# Patient Record
Sex: Male | Born: 1955 | Race: Black or African American | Hispanic: No | Marital: Single | Smoking: Never smoker
Health system: Southern US, Community
[De-identification: ages and names within clinical notes are randomized; demographics above are authoritative.]

## PROBLEM LIST (undated history)

## (undated) DIAGNOSIS — I10 Essential (primary) hypertension: Secondary | ICD-10-CM

## (undated) HISTORY — PX: KNEE SURGERY: SHX244

## (undated) HISTORY — PX: BACK SURGERY: SHX140

---

## 2017-10-02 ENCOUNTER — Emergency Department (HOSPITAL_BASED_OUTPATIENT_CLINIC_OR_DEPARTMENT_OTHER)
Admission: EM | Admit: 2017-10-02 | Discharge: 2017-10-02 | Disposition: A | Discharge status: Home / Self Care | Payer: 59 | Attending: Emergency Medicine | Admitting: Emergency Medicine

## 2017-10-02 ENCOUNTER — Other Ambulatory Visit: Payer: Self-pay

## 2017-10-02 ENCOUNTER — Encounter (HOSPITAL_BASED_OUTPATIENT_CLINIC_OR_DEPARTMENT_OTHER): Payer: Self-pay | Admitting: *Deleted

## 2017-10-02 DIAGNOSIS — J069 Acute upper respiratory infection, unspecified: Secondary | ICD-10-CM | POA: Insufficient documentation

## 2017-10-02 DIAGNOSIS — I10 Essential (primary) hypertension: Secondary | ICD-10-CM | POA: Insufficient documentation

## 2017-10-02 DIAGNOSIS — Z79899 Other long term (current) drug therapy: Secondary | ICD-10-CM | POA: Insufficient documentation

## 2017-10-02 DIAGNOSIS — R05 Cough: Secondary | ICD-10-CM | POA: Diagnosis present

## 2017-10-02 DIAGNOSIS — B9789 Other viral agents as the cause of diseases classified elsewhere: Secondary | ICD-10-CM

## 2017-10-02 HISTORY — DX: Essential (primary) hypertension: I10

## 2017-10-02 MED ORDER — BENZONATATE 100 MG PO CAPS: 100.0000 mg | ORAL_CAPSULE | Freq: Three times a day (TID) | ORAL | 0 refills | Status: AC

## 2017-10-02 MED ORDER — GUAIFENESIN 100 MG/5ML PO LIQD: 100.0000 mg | ORAL | 0 refills | Status: AC | PRN

## 2017-10-02 NOTE — ED Provider Notes (Signed)
[facility] EMERGENCY DEPARTMENT Provider Note   CSN: [id number] Arrival date & time: 10/02/17  1646     History   Chief Complaint Chief Complaint  Patient presents with  . Nasal Congestion    HPI Chris Chan is a 62 y.o. male.  HPI   62 year old male presenting with cold symptoms.  Patient report for the past week he has had sinus congestion, decreased taste bud, cannot smell anything, having chest congestion, and nonproductive cough.  No report of fever, chills, headache, chest pain, shortness of breath, abdominal pain, vomiting, diarrhea, body aches or rash.  He was initially seen at an outside hospital for his symptoms.  States he was given cough medication but his symptoms does not resolve.  He denies tobacco abuse or alcohol abuse.  Past Medical History:  Diagnosis Date  . Hypertension     There are no active problems to display for this patient.   Past Surgical History:  Procedure Laterality Date  . BACK SURGERY    . KNEE SURGERY         Home Medications    Prior to Admission medications   Medication Sig Start Date End Date Taking? Authorizing Provider  AMLODIPINE BESYLATE PO Take by mouth.   Yes [provider]  ATORVASTATIN CALCIUM PO Take by mouth.   Yes [provider]  LOSARTAN POTASSIUM PO Take by mouth.   Yes [provider]    Family History No family history on file.  Social History Social History   Tobacco Use  . Smoking status: Never Smoker  . Smokeless tobacco: Never Used  Substance Use Topics  . Alcohol use: Yes    Comment: weekly  . Drug use: No     Allergies   Prednisone   Review of Systems Review of Systems  All other systems reviewed and are negative.    Physical Exam Updated Vital Signs BP (!) 122/91 (BP Location: Right Arm)   Pulse 80   Temp 98.7 F (37.1 C) (Oral)   Resp 16   Ht 6\' 2"  (1.88 m)   Wt 90.7 kg (200 lb)   SpO2 98%   BMI 25.68 kg/m   Physical Exam    Constitutional: He is oriented to person, place, and time. He appears well-developed and well-nourished. No distress.  HENT:  Head: Atraumatic.  Right Ear: External ear normal.  Left Ear: External ear normal.  Nose: Nose normal.  Mouth/Throat: Oropharynx is clear and moist.  Eyes: Conjunctivae are normal.  Neck: Neck supple.  Cardiovascular: Normal rate and regular rhythm.  Pulmonary/Chest: Effort normal and breath sounds normal. No respiratory distress. He has no wheezes. He has no rales.  Abdominal: Soft. He exhibits no distension. There is no tenderness.  Lymphadenopathy:    He has no cervical adenopathy.  Neurological: He is alert and oriented to person, place, and time.  Skin: No rash noted.  Psychiatric: He has a normal mood and affect.  Nursing note and vitals reviewed.    ED Treatments / Results  Labs (all labs ordered are listed, but only abnormal results are displayed) Labs Reviewed - No data to display  EKG  EKG Interpretation None       Radiology No results found.  Procedures Procedures (including critical care time)  Medications Ordered in ED Medications - No data to display   Initial Impression / Assessment and Plan / ED Course  I have reviewed the triage vital signs and the nursing notes.  Pertinent labs &  imaging results that were available during my care of the patient were reviewed by me and considered in my medical decision making (see chart for details).     BP (!) 122/91 (BP Location: Right Arm)   Pulse 80   Temp 98.7 F (37.1 C) (Oral)   Resp 16   Ht 6\' 2"  (1.88 m)   Wt 90.7 kg (200 lb)   SpO2 98%   BMI 25.68 kg/m    Final Clinical Impressions(s) / ED Diagnoses   Final diagnoses:  Viral URI with cough    ED Discharge Orders        Ordered    guaiFENesin (ROBITUSSIN) 100 MG/5ML liquid  Every 4 hours PRN     10/02/17 1903    benzonatate (TESSALON) 100 MG capsule  Every 8 hours     10/02/17 1903     Pt symptoms  consistent with URI.  Pt will be discharged with symptomatic treatment.  Discussed return precautions.  Pt is hemodynamically stable & in NAD prior to discharge.    Fayrene Helperran, Dilana Mcphie, PA-C 10/02/17 1905    Rolan BuccoBelfi, Melanie, MD 10/02/17 [phone]

## 2017-10-02 NOTE — ED Notes (Signed)
Pt discharged to home with family. NAD.  

## 2017-10-02 NOTE — ED Triage Notes (Signed)
Cough, congestion, "can't smell" x 1 week

## 2019-03-25 ENCOUNTER — Emergency Department (HOSPITAL_BASED_OUTPATIENT_CLINIC_OR_DEPARTMENT_OTHER): Payer: 59

## 2019-03-25 ENCOUNTER — Encounter (HOSPITAL_BASED_OUTPATIENT_CLINIC_OR_DEPARTMENT_OTHER): Payer: Self-pay | Admitting: *Deleted

## 2019-03-25 ENCOUNTER — Other Ambulatory Visit: Payer: Self-pay

## 2019-03-25 ENCOUNTER — Emergency Department (HOSPITAL_BASED_OUTPATIENT_CLINIC_OR_DEPARTMENT_OTHER)
Admission: EM | Admit: 2019-03-25 | Discharge: 2019-03-25 | Disposition: A | Discharge status: Home / Self Care | Payer: 59 | Attending: Emergency Medicine | Admitting: Emergency Medicine

## 2019-03-25 DIAGNOSIS — M47812 Spondylosis without myelopathy or radiculopathy, cervical region: Secondary | ICD-10-CM | POA: Diagnosis not present

## 2019-03-25 DIAGNOSIS — Z79899 Other long term (current) drug therapy: Secondary | ICD-10-CM | POA: Diagnosis not present

## 2019-03-25 DIAGNOSIS — I10 Essential (primary) hypertension: Secondary | ICD-10-CM | POA: Insufficient documentation

## 2019-03-25 DIAGNOSIS — M542 Cervicalgia: Secondary | ICD-10-CM | POA: Diagnosis present

## 2019-03-25 LAB — COMPREHENSIVE METABOLIC PANEL
ALT: 22 U/L (ref 0–44)
AST: 21 U/L (ref 15–41)
Albumin: 4.4 g/dL (ref 3.5–5.0)
Alkaline Phosphatase: 72 U/L (ref 38–126)
Anion gap: 13 (ref 5–15)
BUN: 14 mg/dL (ref 8–23)
CO2: 23 mmol/L (ref 22–32)
Calcium: 9.9 mg/dL (ref 8.9–10.3)
Chloride: 102 mmol/L (ref 98–111)
Creatinine, Ser: 1.01 mg/dL (ref 0.61–1.24)
GFR calc Af Amer: >60 mL/min (ref >60)
GFR calc non Af Amer: >60 mL/min (ref >60)
Glucose, Bld: 96 mg/dL (ref 70–99)
Potassium: 3.4 mmol/L — ABNORMAL LOW (ref 3.5–5.1)
Sodium: 138 mmol/L (ref 135–145)
Total Bilirubin: 0.7 mg/dL (ref 0.3–1.2)
Total Protein: 7.2 g/dL (ref 6.5–8.1)

## 2019-03-25 LAB — CBC WITH DIFFERENTIAL/PLATELET
Abs Immature Granulocytes: 0.02 10*3/uL (ref 0.00–0.07)
Basophils Absolute: 0 10*3/uL (ref 0.0–0.1)
Basophils Relative: 0 %
Eosinophils Absolute: 0.4 10*3/uL (ref 0.0–0.5)
Eosinophils Relative: 5 %
HCT: 34.1 % — ABNORMAL LOW (ref 39.0–52.0)
Hemoglobin: 12.1 g/dL — ABNORMAL LOW (ref 13.0–17.0)
Immature Granulocytes: 0 %
Lymphocytes Relative: 25 %
Lymphs Abs: 2 10*3/uL (ref 0.7–4.0)
MCH: 27.1 pg (ref 26.0–34.0)
MCHC: 35.5 g/dL (ref 30.0–36.0)
MCV: 76.5 fL — ABNORMAL LOW (ref 80.0–100.0)
Monocytes Absolute: 0.9 10*3/uL (ref 0.1–1.0)
Monocytes Relative: 11 %
Neutro Abs: 4.6 10*3/uL (ref 1.7–7.7)
Neutrophils Relative %: 59 %
Platelets: 307 10*3/uL (ref 150–400)
RBC: 4.46 MIL/uL (ref 4.22–5.81)
RDW: 16.3 % — ABNORMAL HIGH (ref 11.5–15.5)
WBC: 7.9 10*3/uL (ref 4.0–10.5)
nRBC: 0 % (ref 0.0–0.2)

## 2019-03-25 LAB — TROPONIN I (HIGH SENSITIVITY): Troponin I (High Sensitivity): 3 ng/L (ref <18)

## 2019-03-25 MED ORDER — CYCLOBENZAPRINE HCL 5 MG PO TABS: 5.0000 mg | ORAL_TABLET | Freq: Three times a day (TID) | ORAL | 0 refills | Status: AC | PRN

## 2019-03-25 MED ORDER — CYCLOBENZAPRINE HCL 5 MG PO TABS
5.0000 mg | ORAL_TABLET | Freq: Once | ORAL | Status: AC
  Administered 2019-03-25: 5 mg via ORAL
  Filled 2019-03-25: qty 1

## 2019-03-25 MED ORDER — FAMOTIDINE IN NACL 20-0.9 MG/50ML-% IV SOLN: 20.0000 mg | Freq: Once | INTRAVENOUS | Status: DC

## 2019-03-25 NOTE — ED Triage Notes (Addendum)
Pt reports spasms in his left shoulder and neck for week. States he "needs to burp" after eating and is unable to do so now. Reports feeling "gas" in left chest

## 2019-03-25 NOTE — ED Provider Notes (Signed)
[facility] EMERGENCY DEPARTMENT Provider Note   CSN: [id number] Arrival date & time: 03/25/19  2012     History   Chief Complaint Chief Complaint  Patient presents with  . Shoulder Pain    neck pain    HPI Chris Chan is a 63 y.o. male hypertension, cervical radiculopathy here presenting with neck pain as well as burning sensation in the left chest.  Patient has been having left-sided neck pain and spasms for the last week or so.  He states that for the last 3 days, he felt like he was unable to burp and has a burning sensation on the left side of his chest.  Denies any shortness of breath.  He tried some Alka-Seltzer with minimal relief. Denies any abdominal pain or vomiting or fevers.  Patient was seen at [facility] earlier this month and had a CT that showed diffuse arthritis.  He has no known cardiac disease. No recent travel or hx of blood clots.      The history is provided by the patient.    Past Medical History:  Diagnosis Date  . Hypertension     There are no active problems to display for this patient.   Past Surgical History:  Procedure Laterality Date  . BACK SURGERY    . KNEE SURGERY          Home Medications    Prior to Admission medications   Medication Sig Start Date End Date Taking? Authorizing Provider  AMLODIPINE BESYLATE PO Take by mouth.    [provider]  ATORVASTATIN CALCIUM PO Take by mouth.    [provider]  benzonatate (TESSALON) 100 MG capsule Take 1 capsule (100 mg total) by mouth every 8 (eight) hours. 10/02/17   Fayrene Helperran, [place], PA-C  guaiFENesin (ROBITUSSIN) 100 MG/5ML liquid Take 5-10 mLs (100-200 mg total) by mouth every 4 (four) hours as needed for cough or congestion. 10/02/17   Fayrene Helperran, [place], PA-C  LOSARTAN POTASSIUM PO Take by mouth.    [provider]    Family History No family history on file.  Social History Social History   Tobacco Use  . Smoking status: Never Smoker  .  Smokeless tobacco: Never Used  Substance Use Topics  . Alcohol use: Not Currently    Comment: weekly  . Drug use: No     Allergies   Prednisone   Review of Systems Review of Systems  Cardiovascular: Positive for chest pain.  Musculoskeletal: Positive for back pain.  All other systems reviewed and are negative.    Physical Exam Updated Vital Signs BP (!) 123/91 (BP Location: Right Arm)   Pulse 81   Temp 98.3 F (36.8 C) (Oral)   Resp 14   Ht 6' 2.5" (1.892 m)   Wt 86.2 kg   SpO2 97%   BMI 24.07 kg/m   Physical Exam Vitals signs and nursing note reviewed.  Constitutional:      Appearance: Normal appearance.  HENT:     Head: Normocephalic.     Mouth/Throat:     Mouth: Mucous membranes are moist.  Eyes:     Extraocular Movements: Extraocular movements intact.     Pupils: Pupils are equal, round, and reactive to light.  Neck:     Musculoskeletal: Normal range of motion and neck supple.     Comments: Mild reproducible L paracervical tenderness, no obvious midline tenderness, nl ROM  Cardiovascular:     Rate and Rhythm: Normal rate and regular rhythm.  Pulses: Normal pulses.     Heart sounds: Normal heart sounds.  Pulmonary:     Effort: Pulmonary effort is normal.     Breath sounds: Normal breath sounds.     Comments: Mild reproducible L chest wall tenderness.  Abdominal:     General: Abdomen is flat.     Palpations: Abdomen is soft.  Musculoskeletal: Normal range of motion.  Skin:    General: Skin is warm.     Capillary Refill: Capillary refill takes less than 2 seconds.  Neurological:     General: No focal deficit present.     Mental Status: He is alert and oriented to person, place, and time.     Comments: CN 2- 12 intact, nl strength and sensation throughout, nl gait   Psychiatric:        Mood and Affect: Mood normal.      ED Treatments / Results  Labs (all labs ordered are listed, but only abnormal results are displayed) Labs Reviewed  CBC  WITH DIFFERENTIAL/PLATELET - Abnormal; Notable for the following components:      Result Value   Hemoglobin 12.1 (*)    HCT 34.1 (*)    MCV 76.5 (*)    RDW 16.3 (*)    All other components within normal limits  COMPREHENSIVE METABOLIC PANEL  TROPONIN I (HIGH SENSITIVITY)    EKG None  Radiology Dg Chest 2 View  Result Date: 03/25/2019 CLINICAL DATA:  Chest pain EXAM: CHEST - 2 VIEW COMPARISON:  03/06/2019 FINDINGS: The heart size and mediastinal contours are within normal limits. Both lungs are clear. Disc degenerative disease of the thoracic spine. IMPRESSION: No acute abnormality of the lungs. Electronically Signed   By: Eddie Candle M.D.   On: 03/25/2019 20:56   Dg Cervical Spine Complete  Result Date: 03/25/2019 CLINICAL DATA:  Spasms in LEFT shoulder and neck for a week, denies injury EXAM: CERVICAL SPINE - COMPLETE 4+ VIEW COMPARISON:  None FINDINGS: Prevertebral soft tissues normal thickness. Multilevel disc space narrowing and endplate spur formation throughout cervical spine. Encroachment upon cervical neural foramina BILATERALLY at multiple levels by uncovertebral spurs. Scattered facet degenerative changes. Mild anterolisthesis at C2-C3, approximately 3 mm. Minimal anterolisthesis C5-C6. No fracture, additional subluxation, or bone destruction. 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 If patient has radicular symptoms to the upper extremities, consider follow-up MR imaging of the cervical spine for further assessment. Electronically Signed   By: Lavonia Dana M.D.   On: 03/25/2019 20:57    Procedures Procedures (including critical care time)  Medications Ordered in ED Medications  cyclobenzaprine (FLEXERIL) tablet 5 mg (5 mg Oral Given 03/25/19 2109)     Initial Impression / Assessment and Plan / ED Course  I have reviewed the triage vital signs and the nursing notes.  Pertinent labs & imaging results that were  available during my care of the patient were reviewed by me and considered in my medical decision making (see chart for details).       Chris Chan is a 63 y.o. male here with L neck pain, burning sensation in the left chest.  Pain is reproducible and had previous arthritis on x-rays.  Patient has unremarkable neuro exams currently.  I think his chest pain is radiation from the neck pain.  He is low risk for ACS and has low heart score and pain has been present for more than 24 hours so once her troponin is sufficient.  I  doubt PE or dissection .  Will get cxr, cervical xray, labs, trop x 1.   10:04 PM CXR clear. Xrays showed arthritis.  Patient has normal strength and sensation in the left upper extremity.  Troponin negative x1.  I think his pain is likely from arthritis in his neck.  Stable for discharge with Flexeril.  Will refer him to spine doctor for further treatment and evaluation.  Patient states that he cannot take prednisone.   Final Clinical Impressions(s) / ED Diagnoses   Final diagnoses:  None    ED Discharge Orders    None       Charlynne PanderYao, Jakwan Sally Hsienta, MD 03/25/19 2205

## 2019-03-25 NOTE — Discharge Instructions (Addendum)
Continue your meloxicam.   Take flexeril for muscle spasms   You have arthritis in your neck and it may be pinching your nerves. Consider following up with neurosurgeon to discuss treatment options   Return to ER if you have worse neck pain, chest pain, trouble breathing.

## 2019-03-25 NOTE — ED Triage Notes (Signed)
MD at bedside during triage. Given ekg to review

## 2019-04-11 ENCOUNTER — Other Ambulatory Visit: Payer: Self-pay | Admitting: Neurosurgery

## 2019-04-11 DIAGNOSIS — M4802 Spinal stenosis, cervical region: Secondary | ICD-10-CM

## 2019-05-05 ENCOUNTER — Other Ambulatory Visit: Payer: Self-pay

## 2019-05-05 ENCOUNTER — Ambulatory Visit
Admission: RE | Admit: 2019-05-05 | Discharge: 2019-05-05 | Disposition: A | Discharge status: Home / Self Care | Payer: 59 | Source: Ambulatory Visit | Attending: Neurosurgery | Admitting: Neurosurgery

## 2019-05-05 DIAGNOSIS — M4802 Spinal stenosis, cervical region: Secondary | ICD-10-CM

## 2021-02-13 IMAGING — CR CERVICAL SPINE - COMPLETE 4+ VIEW
5 series · 5 of 5 positions shown · non-contrast
Comparison: None

CLINICAL DATA: Spasms in LEFT shoulder and neck for a week, denies
injury

EXAM:
CERVICAL SPINE - COMPLETE 4+ VIEW

[w c-spine lat]
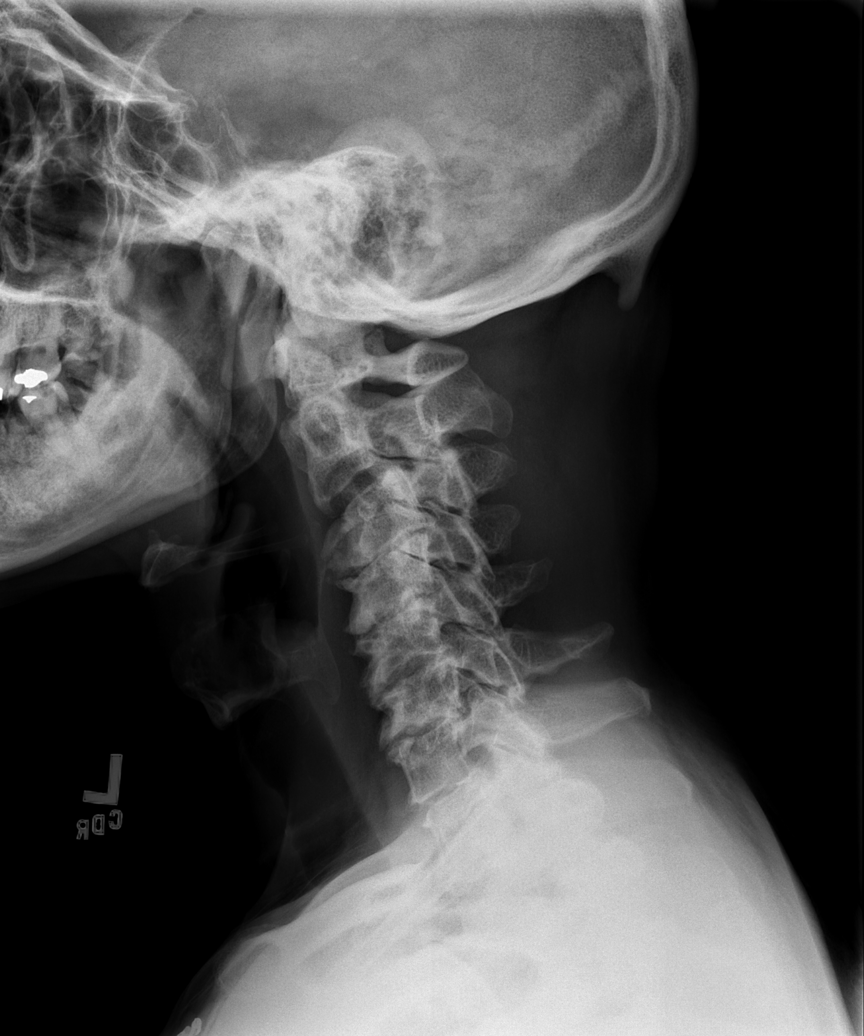

[w c-spine oblique (1 of 2)]
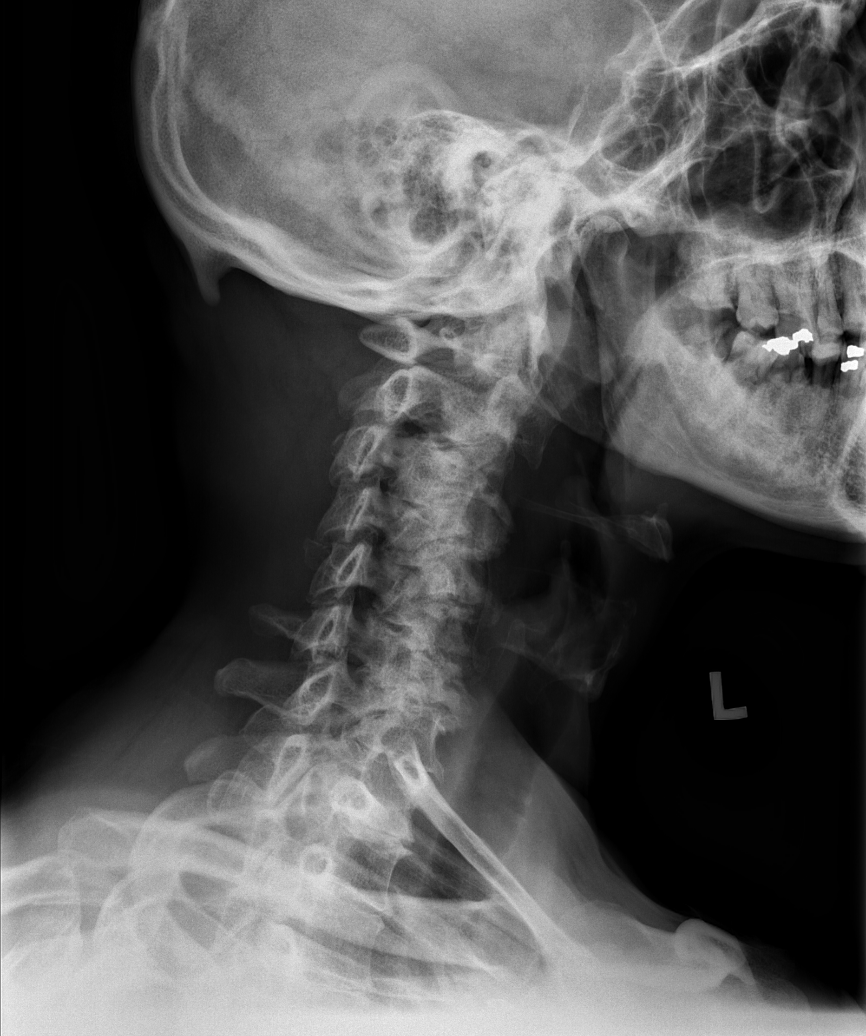

[w c-spine oblique (2 of 2)]
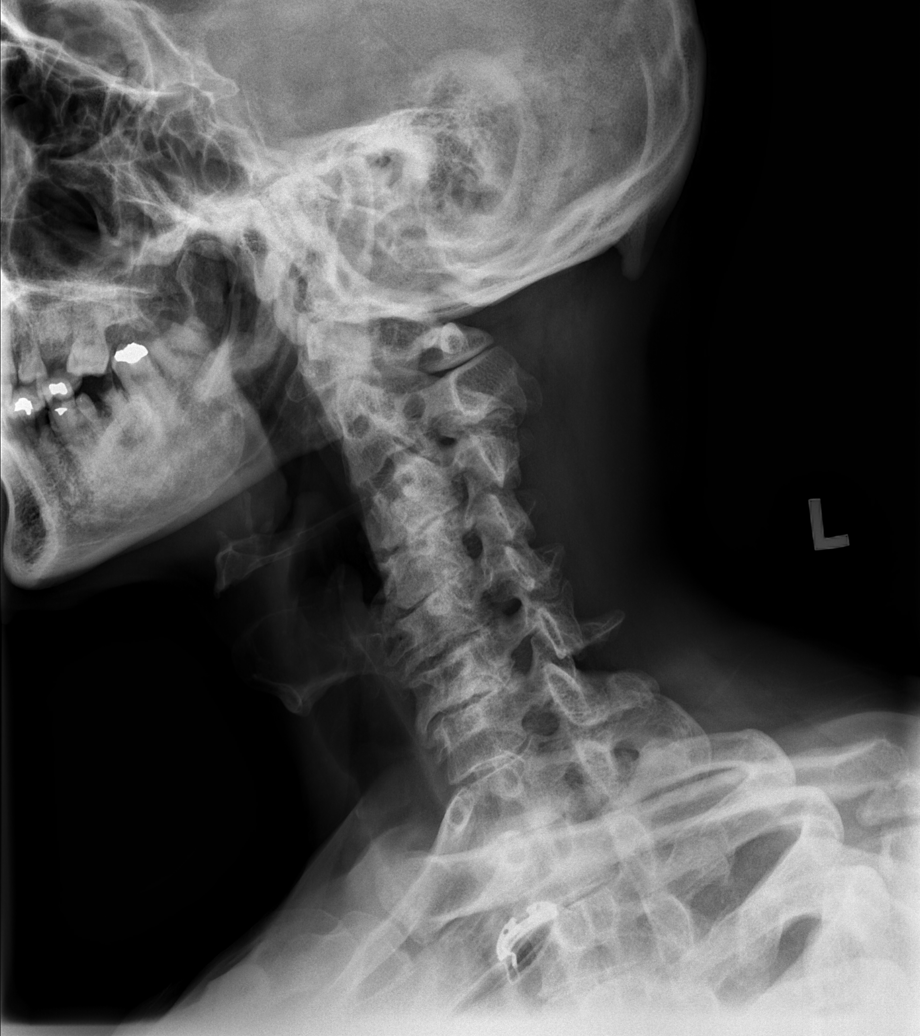

[w c-spine a.p.]
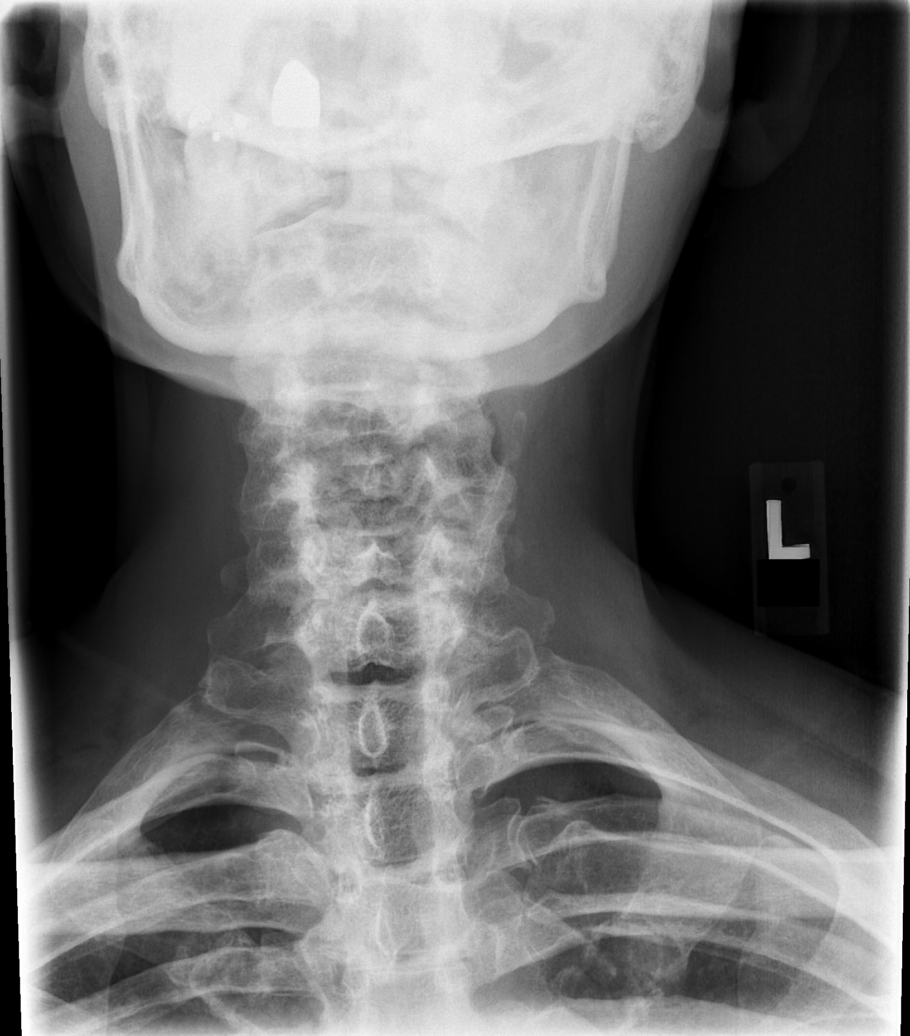

[w c-spine odontoid]
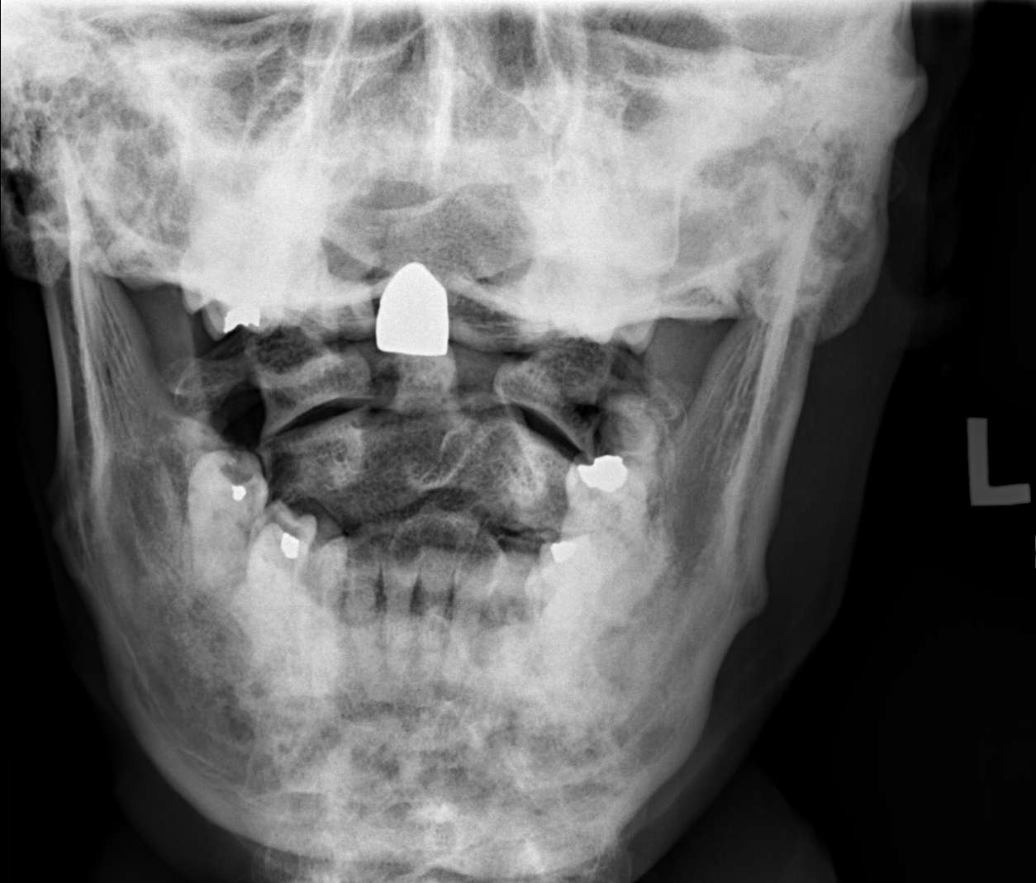

[5 of 5 positions shown; findings below may reference images not displayed]

FINDINGS: Prevertebral soft tissues normal thickness.

Multilevel disc space narrowing and endplate spur formation
throughout cervical spine.

Encroachment upon cervical neural foramina BILATERALLY at multiple
levels by uncovertebral spurs.

Scattered facet degenerative changes.

Mild anterolisthesis at C2-C3, approximately 3 mm.

Minimal anterolisthesis C5-C6.

No fracture, additional subluxation, or bone destruction.
IMPRESSION: Multilevel degenerative disc and facet disease changes of the
cervical spine as above.

If patient has radicular symptoms to the upper extremities, consider
follow-up MR imaging of the cervical spine for further assessment.

## 2021-04-09 ENCOUNTER — Telehealth: Payer: Self-pay | Admitting: Family Medicine

## 2021-04-09 NOTE — Telephone Encounter (Signed)
Called pt to schedule  [facility](Dr. Payton Emerald, referral appt--pt declined states he had to have surgery on hand & no longer needs appt--glh

## 2022-12-17 ENCOUNTER — Encounter: Payer: Self-pay | Admitting: *Deleted
